# Patient Record
Sex: Female | Born: 1990 | Race: White | Hispanic: No | Marital: Married | Smoking: Never smoker
Health system: Southern US, Community
[De-identification: ages and names within clinical notes are randomized; demographics above are authoritative.]

## PROBLEM LIST (undated history)

## (undated) DIAGNOSIS — F419 Anxiety disorder, unspecified: Secondary | ICD-10-CM

## (undated) DIAGNOSIS — F32A Depression, unspecified: Secondary | ICD-10-CM

## (undated) DIAGNOSIS — F329 Major depressive disorder, single episode, unspecified: Secondary | ICD-10-CM

## (undated) DIAGNOSIS — F988 Other specified behavioral and emotional disorders with onset usually occurring in childhood and adolescence: Secondary | ICD-10-CM

## (undated) HISTORY — PX: WISDOM TOOTH EXTRACTION: SHX21

---

## 2016-09-11 ENCOUNTER — Ambulatory Visit
Admission: EM | Admit: 2016-09-11 | Discharge: 2016-09-11 | Disposition: A | Discharge status: Home / Self Care | Payer: No Typology Code available for payment source | Attending: Family Medicine | Admitting: Family Medicine

## 2016-09-11 ENCOUNTER — Ambulatory Visit (INDEPENDENT_AMBULATORY_CARE_PROVIDER_SITE_OTHER): Payer: No Typology Code available for payment source

## 2016-09-11 DIAGNOSIS — S60131A Contusion of right middle finger with damage to nail, initial encounter: Secondary | ICD-10-CM

## 2016-09-11 DIAGNOSIS — S63601A Unspecified sprain of right thumb, initial encounter: Secondary | ICD-10-CM

## 2016-09-11 HISTORY — DX: Depression, unspecified: F32.A

## 2016-09-11 HISTORY — DX: Other specified behavioral and emotional disorders with onset usually occurring in childhood and adolescence: F98.8

## 2016-09-11 HISTORY — DX: Major depressive disorder, single episode, unspecified: F32.9

## 2016-09-11 HISTORY — DX: Anxiety disorder, unspecified: F41.9

## 2016-09-11 LAB — PREGNANCY, URINE: PREG TEST UR: NEGATIVE

## 2016-09-11 MED ORDER — MELOXICAM 15 MG PO TABS: 15.0000 mg | ORAL_TABLET | Freq: Every day | ORAL | 0 refills | Status: AC | PRN

## 2016-09-11 NOTE — Discharge Instructions (Signed)
Take medication as prescribed. Rest. Apply ice. Wear splint as needed for pain. Stretch daily.   Follow up with your primary care physician this week as needed. Return to Urgent care for new or worsening concerns.

## 2016-09-11 NOTE — ED Provider Notes (Signed)
[facility] ____________________________________________  Time seen: Approximately 3:06 PM  I have reviewed the triage vital signs and the nursing notes.   HISTORY  Chief Complaint Hand Injury   HPI Claudia Clark is a 25 y.o. female  presents for the complaint of right hand pain. Patient reports she injured her right thumb a week ago. Patient reports that she was trying to open a large bag and used her thumb to push into the bag to break through the plastic bag. Patient reports she has had some pain at the base of her thumb since. Patient reports that it felt like a jamming her thumb, and states wanted evaluated as she has had some pain continued in the area. Patient reports the pain has improved but not fully resolved. Patient also reports pain to her right third fingernail. Patient reports 2 days ago she accidentally shut the fingertip in the door. Patient reports she has had some pain to that area since. Denies decreased range of motion. Denies any numbness or tingling sensation. Denies any other pain or injury. Patient reports she is right-hand dominant. 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 Denies pain radiation. Denies any other injury.  Patient reports that she feels well otherwise. Denies any other pain and right upper extremity. Denies any other complaints.  LMP: States last menstrual period was mid September. But states was slightly lighter than normal.  Past Medical History:  Diagnosis Date  . ADD (attention deficit disorder)   . Anxiety   . Depression     There are no active problems to display for this patient.   Past Surgical History:  Procedure Laterality Date  . WISDOM TOOTH EXTRACTION      Current Outpatient Rx  . Order #: [id number] Class: Historical Med  . Order #: [id number] Class: Historical Med  . Order #: [id number] Class: Historical Med  . Order #: [id number] Class: Normal    No current  facility-administered medications for this encounter.   Current Outpatient Prescriptions:  .  amphetamine-dextroamphetamine (ADDERALL XR) 15 MG 24 hr capsule, Take 15 mg by mouth every morning., Disp: , Rfl:  .  citalopram (CELEXA) 20 MG tablet, Take 20 mg by mouth daily., Disp: , Rfl:  .  norethindrone-ethinyl estradiol-iron (ESTROSTEP FE,TILIA FE,TRI-LEGEST FE) 1-20/1-30/1-35 MG-MCG tablet, Take 1 tablet by mouth daily., Disp: , Rfl:  .  meloxicam (MOBIC) 15 MG tablet, Take 1 tablet (15 mg total) by mouth daily as needed for pain., Disp: 10 tablet, Rfl: 0  Allergies Penicillins  History reviewed. No pertinent family history.  Social History Social History  Substance Use Topics  . Smoking status: Never Smoker  . Smokeless tobacco: Never Used  . Alcohol use No    Review of Systems Constitutional: No fever/chills Eyes: No visual changes. ENT: No sore throat. Cardiovascular: Denies chest pain. Respiratory: Denies shortness of breath. Gastrointestinal: No abdominal pain.  No nausea, no vomiting.  No diarrhea.  No constipation. Genitourinary: Negative for dysuria. Musculoskeletal: Negative for back pain.As above.  Skin: Negative for rash. Neurological: Negative for headaches, focal weakness or numbness.  10-point ROS otherwise negative.  ____________________________________________   PHYSICAL EXAM:  VITAL SIGNS: ED Triage Vitals  Enc Vitals Group     BP 09/11/16 1447 115/78     Pulse Rate 09/11/16 1447 86     Resp 09/11/16 1447 18     Temp 09/11/16 1447 98.1 F (36.7 C)     Temp Source 09/11/16 1447 Oral  SpO2 09/11/16 1447 100 %     Weight 09/11/16 1449 130 lb (59 kg)     Height 09/11/16 1449 5\' 6"  (1.676 m)     Head Circumference --      Peak Flow --      Pain Score 09/11/16 1452 4     Pain Loc --      Pain Edu? --      Excl. in GC? --     Constitutional: Alert and oriented. Well appearing and in no acute distress. Eyes: Conjunctivae are normal. PERRL.  EOMI. ENT      Head: Normocephalic and atraumatic.      Mouth/Throat: Mucous membranes are moist. Cardiovascular: Normal rate, regular rhythm. Grossly normal heart sounds.  Good peripheral circulation. Respiratory: Normal respiratory effort without tachypnea nor retractions. Breath sounds are clear and equal bilaterally. No wheezes/rales/rhonchi.. Musculoskeletal:  No midline cervical, thoracic or lumbar tenderness to palpation.  Except: Right proximal thumb and thumb Pad mild tenderness palpation, no swelling, no ecchymosis, right thumb full range of motion, minimal pain with right thumb resisted extension and flexion, normal distal sensation. Right distal third digit small subungual hematoma noted, minimal distal phalanx tenderness palpation, skin intact, full range of motion, no pain with resisted distal flexion or extension, normal distal sensation, right hand otherwise nontender. Bilateral hand grips strong and equal. Bilateral distal radial pulses strong and equal. Neurologic:  Normal speech and language. No gross focal neurologic deficits are appreciated. Speech is normal. No gait instability.  Skin:  Skin is warm, dry and intact. No rash noted. Psychiatric: Mood and affect are normal. Speech and behavior are normal. Patient exhibits appropriate insight and judgment   ___________________________________________   LABS (all labs ordered are listed, but only abnormal results are displayed)  Labs Reviewed  PREGNANCY, URINE   ____________________________________________  DG HAND COMPLETE RIGHT SPLINT WRIST RIGHT HAND - COMPLETE 3+ VIEW  COMPARISON:  None.  FINDINGS: Frontal, oblique, and lateral views were obtained. There is no fracture or dislocation. Joint spaces appear normal. No erosive change.  IMPRESSION: No fracture or dislocation.  No apparent arthropathy.   Electronically Signed   By: Bretta Bang III M.D.   On: 09/11/2016 15:13   PROCEDURES Procedures     right thumb spica Velcro splint applied by applied. Neurovascular intact post application patient.   INITIAL IMPRESSION / ASSESSMENT AND PLAN / ED COURSE  Pertinent labs & imaging results that were available during my care of the patient were reviewed by me and considered in my medical decision making (see chart for details).  Plan patient. No acute distress. Presents with a compensatory right hand pain post to mechanical injuries. Suspect right thumb sprain injury and right third digit subungual hematoma. Urine pregnancy test negative. Right hand x-ray no fracture or dislocation, no apparent arthropathy per radiologist. Suspect subungual hematoma and sprain injury. Will treat conservatively. Right Velcro thumb spica splint applied for support. Encouraged wearing sling for 2-3 days as long as pain continues. Encourage stretching, and ice, rest. Will treat with daily oral Mobic. Discussed indication, risks and benefits of medications with patient. Follow up with PCP or ortho as needed for continued pain. Work note for today and tomorrow given.   Discussed follow up with Primary care physician this week. Discussed follow up and return parameters including no resolution or any worsening concerns. Patient verbalized understanding and agreed to plan.   ____________________________________________   FINAL CLINICAL IMPRESSION(S) / ED DIAGNOSES  Final diagnoses:  Subungual hematoma of  third finger of right hand, initial encounter  Sprain of right thumb, initial encounter     Discharge Medication List as of 09/11/2016  3:38 PM    START taking these medications   Details  meloxicam (MOBIC) 15 MG tablet Take 1 tablet (15 mg total) by mouth daily as needed for pain., Starting Thu 09/11/2016, Normal        Note: This dictation was prepared with Dragon dictation along with smaller phrase technology. Any transcriptional errors that result from this process are unintentional.    Clinical Course        Renford DillsLindsey Lyzette Reinhardt, NP 09/11/16 1714

## 2016-09-11 NOTE — ED Triage Notes (Signed)
Pt reports jamming her right thumb a week ago and then slammed her middle finger tip in front door 2 days ago. Limited ROM. Some swelling noted at base of thumb. Pain 4/10

## 2017-06-23 IMAGING — CR DG HAND COMPLETE 3+V*R*
3 series · 3 of 3 positions shown · non-contrast
Comparison: None.

CLINICAL DATA: Pain after hitting hand on solid object

EXAM:
RIGHT HAND - COMPLETE 3+ VIEW

[hand ap]
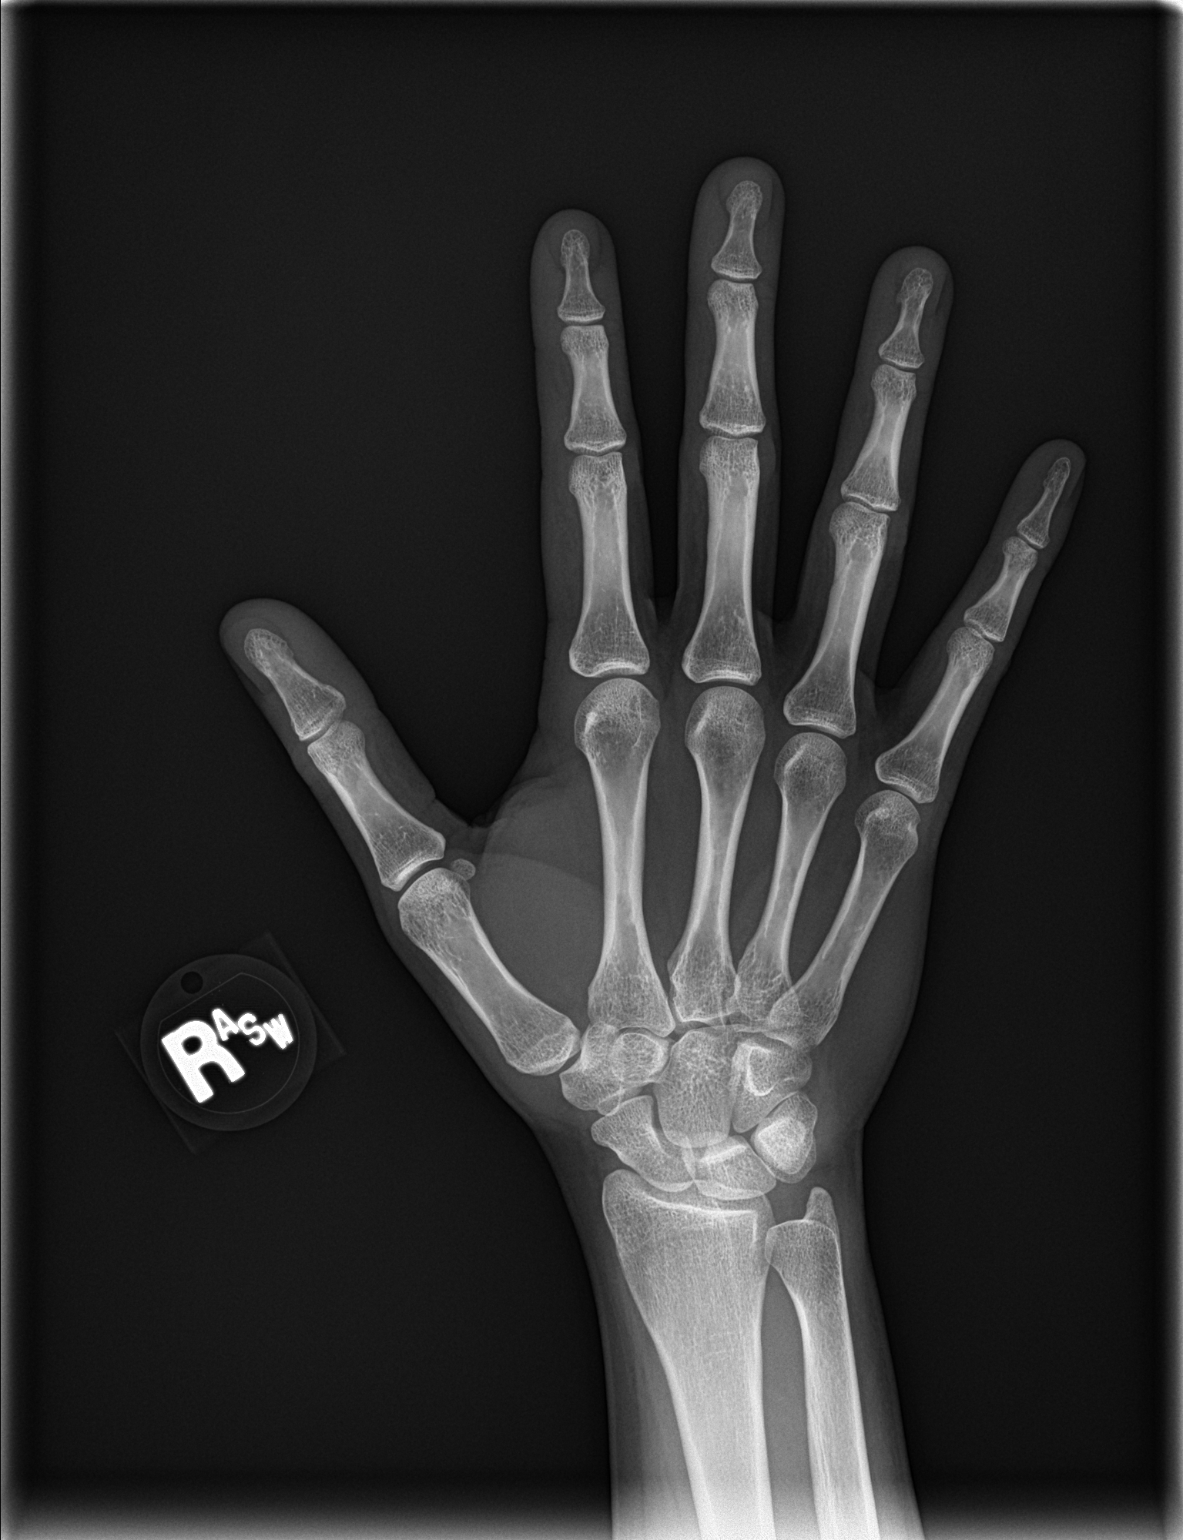

[hand obl]
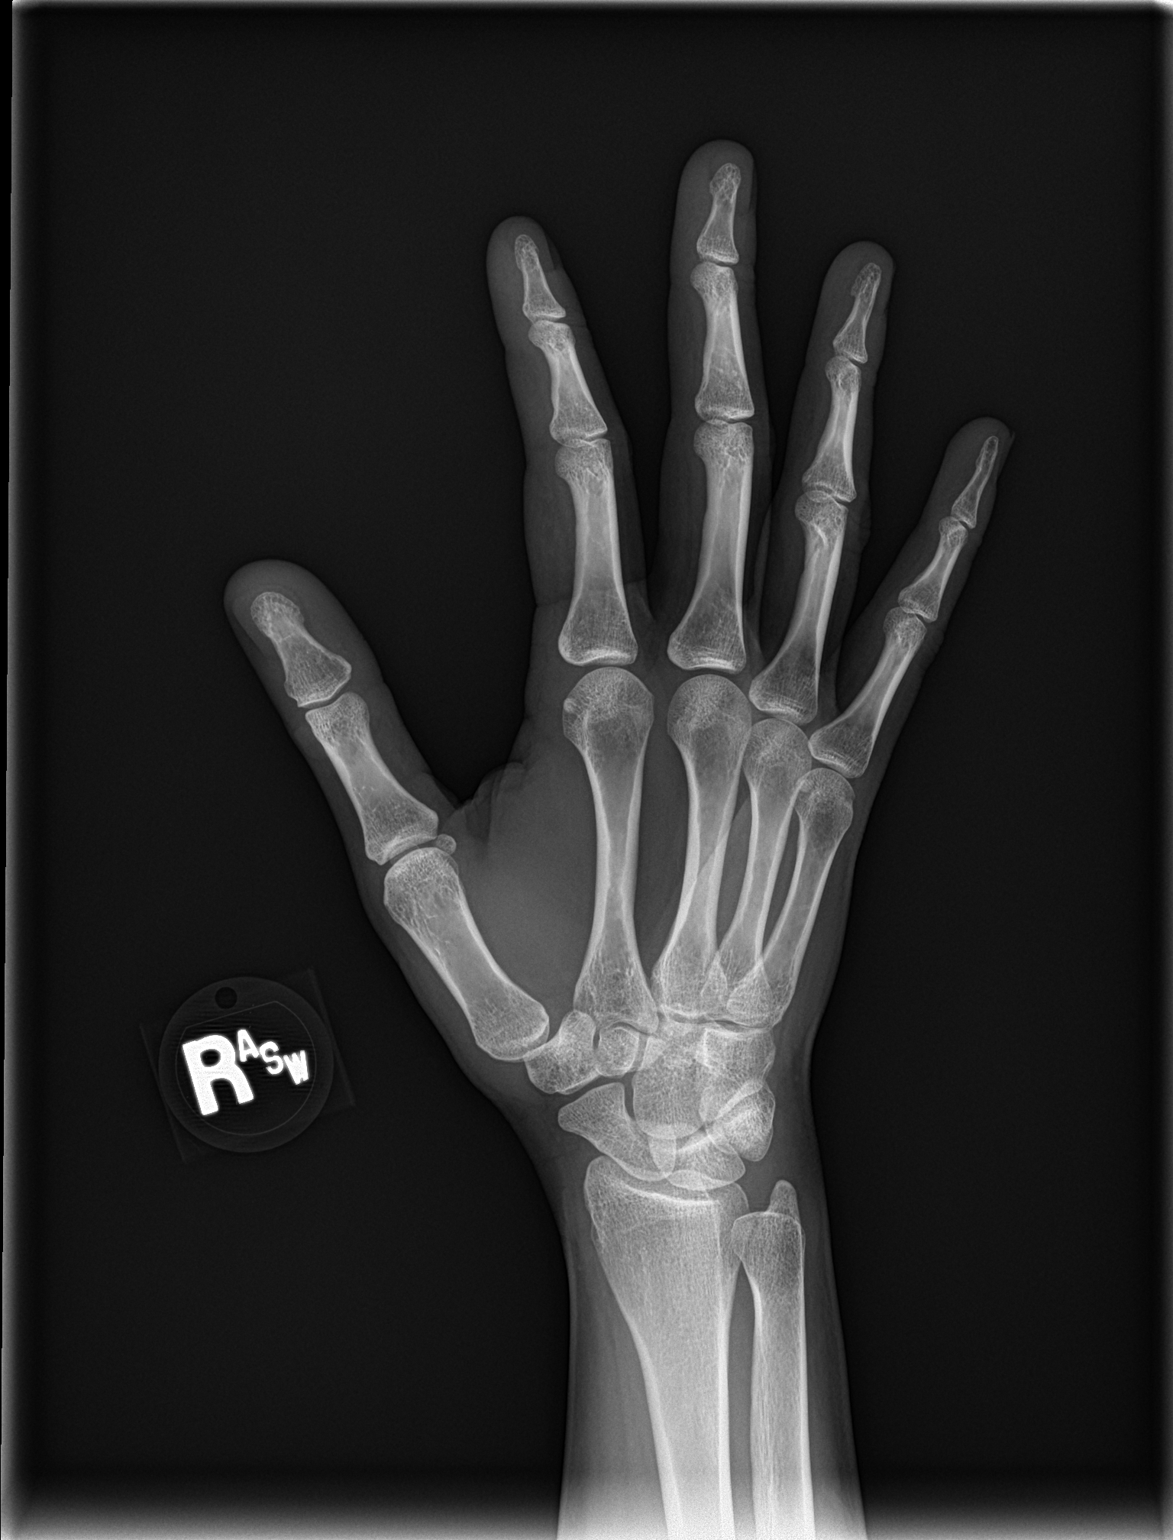

[hand lat]
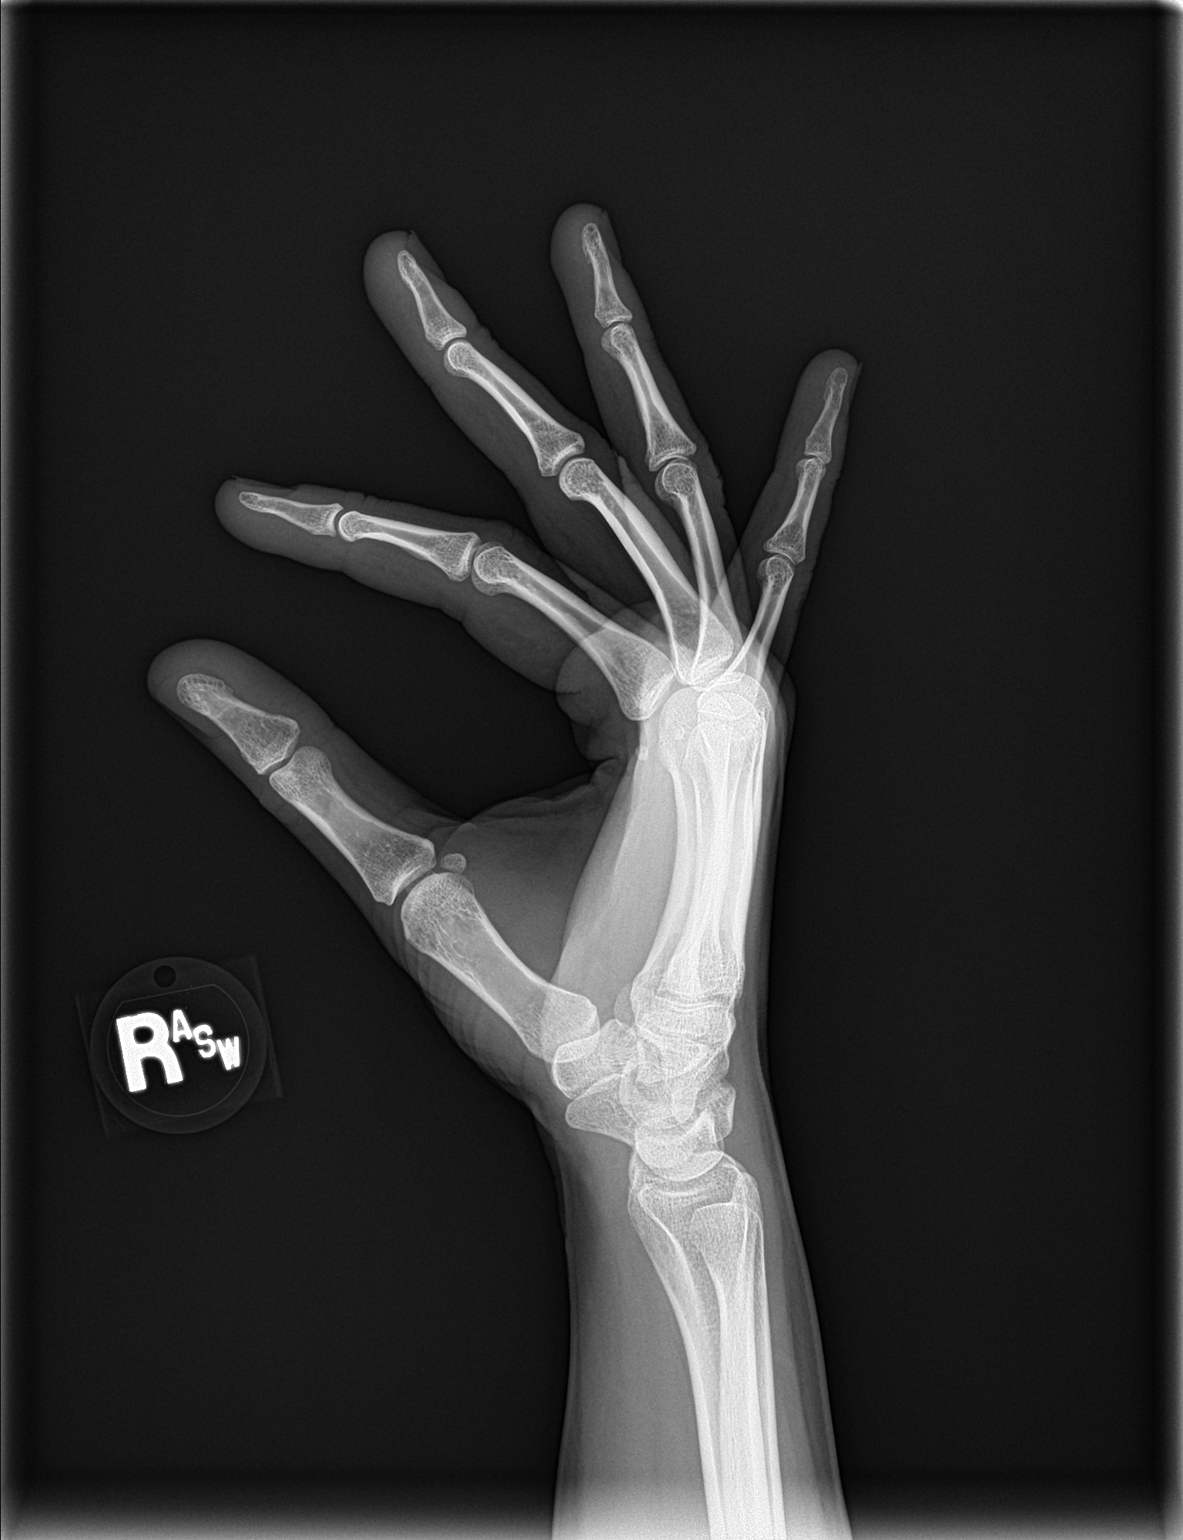

[3 of 3 positions shown; findings below may reference images not displayed]

FINDINGS: Frontal, oblique, and lateral views were obtained. There is no
fracture or dislocation. Joint spaces appear normal. No erosive
change.
IMPRESSION: No fracture or dislocation.  No apparent arthropathy.
